# Patient Record
Sex: Male | Born: 1955 | Race: White | Hispanic: No | Marital: Married | Smoking: Former smoker
Health system: Southern US, Community
[De-identification: ages and names within clinical notes are randomized; demographics above are authoritative.]

## PROBLEM LIST (undated history)

## (undated) HISTORY — PX: HERNIA REPAIR: SHX51

---

## 2000-10-15 ENCOUNTER — Ambulatory Visit (HOSPITAL_COMMUNITY): Admission: RE | Admit: 2000-10-15 | Discharge: 2000-10-15 | Payer: Self-pay | Admitting: Orthopaedic Surgery

## 2006-01-02 ENCOUNTER — Ambulatory Visit: Payer: Self-pay | Admitting: Internal Medicine

## 2006-01-28 ENCOUNTER — Ambulatory Visit: Payer: Self-pay | Admitting: Internal Medicine

## 2012-02-10 ENCOUNTER — Ambulatory Visit (INDEPENDENT_AMBULATORY_CARE_PROVIDER_SITE_OTHER): Payer: 59 | Admitting: Emergency Medicine

## 2012-02-10 ENCOUNTER — Encounter: Payer: Self-pay | Admitting: Emergency Medicine

## 2012-02-10 DIAGNOSIS — R739 Hyperglycemia, unspecified: Secondary | ICD-10-CM

## 2012-02-10 DIAGNOSIS — R7309 Other abnormal glucose: Secondary | ICD-10-CM

## 2012-02-10 DIAGNOSIS — Z0000 Encounter for general adult medical examination without abnormal findings: Secondary | ICD-10-CM

## 2012-02-10 DIAGNOSIS — N529 Male erectile dysfunction, unspecified: Secondary | ICD-10-CM | POA: Insufficient documentation

## 2012-02-10 DIAGNOSIS — E785 Hyperlipidemia, unspecified: Secondary | ICD-10-CM | POA: Insufficient documentation

## 2012-02-10 LAB — COMPREHENSIVE METABOLIC PANEL
ALT: 12 U/L (ref 0–53)
AST: 18 U/L (ref 0–37)
Albumin: 4.7 g/dL (ref 3.5–5.2)
Alkaline Phosphatase: 55 U/L (ref 39–117)
BUN: 15 mg/dL (ref 6–23)
CO2: 24 mEq/L (ref 19–32)
Calcium: 9.5 mg/dL (ref 8.4–10.5)
Chloride: 102 mEq/L (ref 96–112)
Creat: 0.75 mg/dL (ref 0.50–1.35)
Glucose, Bld: 93 mg/dL (ref 70–99)
Potassium: 4 mEq/L (ref 3.5–5.3)
Sodium: 139 mEq/L (ref 135–145)
Total Bilirubin: 0.7 mg/dL (ref 0.3–1.2)
Total Protein: 7.2 g/dL (ref 6.0–8.3)

## 2012-02-10 LAB — CBC WITH DIFFERENTIAL/PLATELET
Basophils Absolute: 0 K/uL (ref 0.0–0.1)
Basophils Relative: 0 % (ref 0–1)
Eosinophils Absolute: 0 K/uL (ref 0.0–0.7)
Eosinophils Relative: 1 % (ref 0–5)
HCT: 44.6 % (ref 39.0–52.0)
Hemoglobin: 16 g/dL (ref 13.0–17.0)
Lymphocytes Relative: 24 % (ref 12–46)
Lymphs Abs: 1.1 K/uL (ref 0.7–4.0)
MCH: 31.1 pg (ref 26.0–34.0)
MCHC: 35.9 g/dL (ref 30.0–36.0)
MCV: 86.8 fL (ref 78.0–100.0)
Monocytes Absolute: 0.4 K/uL (ref 0.1–1.0)
Monocytes Relative: 10 % (ref 3–12)
Neutro Abs: 3 K/uL (ref 1.7–7.7)
Neutrophils Relative %: 66 % (ref 43–77)
Platelets: 219 K/uL (ref 150–400)
RBC: 5.14 MIL/uL (ref 4.22–5.81)
RDW: 13.6 % (ref 11.5–15.5)
WBC: 4.5 K/uL (ref 4.0–10.5)

## 2012-02-10 LAB — LIPID PANEL
Cholesterol: 182 mg/dL (ref 0–200)
HDL: 52 mg/dL (ref >39)
LDL Cholesterol: 106 mg/dL — ABNORMAL HIGH (ref 0–99)
Total CHOL/HDL Ratio: 3.5 Ratio
Triglycerides: 118 mg/dL (ref <150)
VLDL: 24 mg/dL (ref 0–40)

## 2012-02-10 LAB — POCT URINALYSIS DIPSTICK
Bilirubin, UA: NEGATIVE
Blood, UA: NEGATIVE
Glucose, UA: NEGATIVE
Ketones, UA: 15
Leukocytes, UA: NEGATIVE
Nitrite, UA: NEGATIVE
Protein, UA: NEGATIVE
Spec Grav, UA: 1.02
Urobilinogen, UA: 0.2
pH, UA: 6.5

## 2012-02-10 LAB — POCT UA - MICROSCOPIC ONLY
Crystals, Ur, HPF, POC: NEGATIVE
RBC, urine, microscopic: 0.2
WBC, Ur, HPF, POC: NEGATIVE
Yeast, UA: NEGATIVE

## 2012-02-10 LAB — IFOBT (OCCULT BLOOD): IFOBT: NEGATIVE

## 2012-02-10 LAB — POCT GLYCOSYLATED HEMOGLOBIN (HGB A1C): Hemoglobin A1C: 5.4

## 2012-02-10 LAB — GLUCOSE, POCT (MANUAL RESULT ENTRY): POC Glucose: 88

## 2012-02-10 LAB — TSH: TSH: 2.905 u[IU]/mL (ref 0.350–4.500)

## 2012-02-10 NOTE — Progress Notes (Signed)
1-Near w/o: OD:20/50  2-OS:20/40  3-OU20/40   Near with: OD:20/25  OD:20/25  OU:20/25       2-Far w/o:  OD:20/30   OS:20/30   OU:20/30    3-Far with: OD:20/20  OS:20/20   OU:20/20   Intermediate:w/out  OD:20/20   OS:20/20   OD:20/20    With: OD:20/20   OS:20/20   OU:20/20     ISHIHARA: 14 of 14 plates   Peripheral:OD: 85 degrees  OS: 85 degrees

## 2012-02-10 NOTE — Progress Notes (Signed)
  Subjective:    Patient ID: Damon Pope, male    DOB: 09-16-1956, 56 y.o.   MRN: [id number]  HPI patient is here for his yearly physical and FAA exam. He does not like to take medication and prefers to treat his high cholesterol with diet. He was on exercise program recently he was able to lose weight however since Christmas he has not done well with this and started to put on weight again.    Review of Systems  Constitutional: Negative.   HENT: Negative.   Eyes: Negative.   Respiratory: Negative.   Cardiovascular: Negative.   Gastrointestinal: Negative.   Genitourinary: Negative.   Musculoskeletal: Negative.   Skin: Negative.   Neurological: Negative.   Hematological: Negative.   Psychiatric/Behavioral: Negative.        Objective:   Physical Exam  Constitutional: He appears well-developed and well-nourished.  HENT:  Head: Normocephalic.  Right Ear: External ear normal.  Left Ear: External ear normal.  Nose: Nose normal.  Mouth/Throat: No oropharyngeal exudate.  Eyes: EOM are normal. Pupils are equal, round, and reactive to light.  Neck: No JVD present. No tracheal deviation present. No thyromegaly present.  Cardiovascular: Normal rate, normal heart sounds and intact distal pulses.        He has occasional skipped beats on auscultatory exam  Pulmonary/Chest: Breath sounds normal. No respiratory distress. He has no wheezes. He has no rales. He exhibits no tenderness.  Abdominal: He exhibits no distension and no mass. There is no tenderness. There is no rebound and no guarding.  Genitourinary: Rectum normal and penis normal.  Musculoskeletal: He exhibits no edema and no tenderness.  Lymphadenopathy:    He has no cervical adenopathy.  Neurological: He is alert. No cranial nerve deficit. Coordination normal.  Skin: Skin is warm and dry.  Psychiatric: He has a normal mood and affect. His behavior is normal.          Assessment & Plan:  Patient is overall normal  physical exam. 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 Check baseline cholesterol today and that this has been elevated in the past. He knows he needs to start back on his exercise program and dropped his weight

## 2012-02-12 ENCOUNTER — Encounter: Payer: Self-pay | Admitting: Radiology

## 2012-02-18 ENCOUNTER — Telehealth: Payer: Self-pay

## 2012-02-18 DIAGNOSIS — N529 Male erectile dysfunction, unspecified: Secondary | ICD-10-CM

## 2012-02-18 NOTE — Telephone Encounter (Signed)
Seen recently for a CPE with Dr. Cleta Alberts, would like an rx for Levitra called into [facility] on [address]

## 2012-02-18 NOTE — Telephone Encounter (Signed)
Dr. Cleta Alberts there is no documentation about the need for Levitra so I will let you decide if this is appropriate.

## 2012-02-19 MED ORDER — VARDENAFIL HCL 20 MG PO TABS: 20.0000 mg | ORAL_TABLET | Freq: Every day | ORAL | Status: AC | PRN

## 2012-02-19 NOTE — Telephone Encounter (Signed)
Please let patient know I sent his medications to the pharmacy to

## 2012-02-20 NOTE — Telephone Encounter (Signed)
PT Notified

## 2012-03-02 ENCOUNTER — Ambulatory Visit: Payer: Self-pay | Admitting: Emergency Medicine

## 2013-04-19 ENCOUNTER — Encounter: Payer: 59 | Admitting: Emergency Medicine

## 2013-05-06 ENCOUNTER — Encounter: Payer: 59 | Admitting: Family Medicine

## 2013-05-10 ENCOUNTER — Encounter: Payer: Self-pay | Admitting: Family Medicine

## 2013-05-10 ENCOUNTER — Ambulatory Visit (INDEPENDENT_AMBULATORY_CARE_PROVIDER_SITE_OTHER): Payer: 59 | Admitting: Family Medicine

## 2013-05-10 VITALS — BP 129/92 | HR 74 | Temp 97.5°F | Resp 18 | Ht 73.0 in | Wt 227.0 lb

## 2013-05-10 DIAGNOSIS — Z0000 Encounter for general adult medical examination without abnormal findings: Secondary | ICD-10-CM

## 2013-05-10 DIAGNOSIS — E663 Overweight: Secondary | ICD-10-CM | POA: Insufficient documentation

## 2013-05-10 LAB — POCT GLYCOSYLATED HEMOGLOBIN (HGB A1C): Hemoglobin A1C: 5.5

## 2013-05-10 LAB — POCT URINALYSIS DIPSTICK
Bilirubin, UA: NEGATIVE
Blood, UA: NEGATIVE
Ketones, UA: NEGATIVE
Protein, UA: NEGATIVE
pH, UA: 6.5

## 2013-05-10 NOTE — Patient Instructions (Signed)

## 2013-05-10 NOTE — Progress Notes (Signed)
Subjective:    Patient ID: Damon Pope, male    DOB: 02/22/1956, 57 y.o.   MRN: [id number]  HPI  This 57 y.o. Cauc male is here for CPE and labs for employee-sponsored wellness program. He has FAA pilot physical performed every 2 years by Dr. Cleta Alberts. He has no chronic health issues  and is exercising for weight reduction. Review of labs from March 2013 revealed no significant  abnormalities (LDL=106) and T Chol/HDL ratio= 3.5. HCM screenings are current.  Patient Active Problem List   Diagnosis Date Noted  . ED (erectile dysfunction) 02/10/2012  . Hyperglycemia 02/10/2012  . Hyperlipidemia 02/10/2012    PMHx, Soc Hx and Fam Hx reviewed.   Review of Systems  Constitutional: Negative for fever, appetite change, fatigue and unexpected weight change.  HENT: Negative.   Eyes:       Some vision changes; pt uses "readers".  Respiratory: Negative for apnea, cough, chest tightness and shortness of breath.   Cardiovascular: Negative.   Gastrointestinal: Negative.   Endocrine: Negative.   Genitourinary: Negative.   Musculoskeletal: Negative.   Skin: Negative.   Allergic/Immunologic: Negative.   Neurological: Negative.   Hematological: Negative.   Psychiatric/Behavioral: Negative.        Objective:   Physical Exam  Nursing note and vitals reviewed. Constitutional: He is oriented to person, place, and time. Vital signs are normal. He appears well-developed and well-nourished. No distress.  HENT:  Head: Normocephalic and atraumatic.  Right Ear: Hearing, tympanic membrane, external ear and ear canal normal.  Left Ear: Hearing, tympanic membrane, external ear and ear canal normal.  Nose: Nose normal. No mucosal edema, rhinorrhea, nasal deformity or septal deviation.  Mouth/Throat: Uvula is midline, oropharynx is clear and moist and mucous membranes are normal. No oral lesions. Normal dentition. No dental caries.  Eyes: Conjunctivae, EOM and lids are normal. No scleral icterus.   Fundoscopic exam:      The right eye shows no arteriolar narrowing and no papilledema. The right eye shows red reflex.       The left eye shows no arteriolar narrowing and no papilledema. The left eye shows red reflex.  Fundi difficult to assess.  Neck: Normal range of motion. Neck supple. No JVD present. No thyromegaly present.  Cardiovascular: Normal rate, regular rhythm, normal heart sounds and intact distal pulses.  Exam reveals no gallop and no friction rub.   No murmur heard. Pulmonary/Chest: Effort normal and breath sounds normal. No respiratory distress. He has no wheezes. He exhibits no tenderness.  Abdominal: Soft. Normal appearance and normal aorta. He exhibits no distension, no pulsatile midline mass and no mass. There is no hepatosplenomegaly. There is no tenderness. There is no guarding and no CVA tenderness. No hernia.  Genitourinary:  GU exam deferred; pt has no symptoms of any GI, GU or prostate problems.  Musculoskeletal: Normal range of motion. He exhibits no edema and no tenderness.  Lymphadenopathy:    He has no cervical adenopathy.  Neurological: He is alert and oriented to person, place, and time. He has normal reflexes. No cranial nerve deficit. He exhibits normal muscle tone. Coordination normal.  Skin: Skin is warm and dry. No rash noted. No erythema. No pallor.  Psychiatric: He has a normal mood and affect. His behavior is normal. Judgment and thought content normal.    Results for orders placed in visit on 05/10/13  POCT URINALYSIS DIPSTICK      Result Value Range   Color, UA yellow  Clarity, UA clear     Glucose, UA neg     Bilirubin, UA neg     Ketones, UA neg     Spec Grav, UA 1.010     Blood, UA neg     pH, UA 6.5     Protein, UA neg     Urobilinogen, UA 0.2     Nitrite, UA neg     Leukocytes, UA Negative    POCT GLYCOSYLATED HEMOGLOBIN (HGB A1C)      Result Value Range   Hemoglobin A1C 5.5    IFOBT (OCCULT BLOOD)      Result Value Range    IFOBT Negative          Assessment & Plan:  Routine general medical examination at a health care facility - Plan: POCT urinalysis dipstick, Comprehensive metabolic panel, Lipid panel, PSA, POCT glycosylated hemoglobin (Hb A1C), IFOBT POC (occult bld, rslt in office)  Overweight (BMI 25.0-29.9)  RTC in 2014 for FAA physical with Dr. Cleta Alberts.

## 2013-05-11 LAB — LIPID PANEL
Cholesterol: 184 mg/dL (ref 0–200)
HDL: 53 mg/dL (ref >39)
Total CHOL/HDL Ratio: 3.5 Ratio
Triglycerides: 135 mg/dL (ref <150)

## 2013-05-11 LAB — COMPREHENSIVE METABOLIC PANEL
BUN: 12 mg/dL (ref 6–23)
CO2: 25 mEq/L (ref 19–32)
Calcium: 9.3 mg/dL (ref 8.4–10.5)
Chloride: 103 mEq/L (ref 96–112)
Creat: 0.86 mg/dL (ref 0.50–1.35)

## 2013-05-11 NOTE — Progress Notes (Signed)
Quick Note:  Please notify pt that results are normal.   Provide pt with copy of labs. ______ 

## 2014-01-10 ENCOUNTER — Telehealth: Payer: Self-pay

## 2014-01-10 NOTE — Telephone Encounter (Signed)
He called regarding mother, Damon Pope 09/23/28, she needs to come in and get PT/INR rechecked. Per Dr. Dareen PianoAnderson place standing order for PT/INR. He will bring her in today for lab only visit

## 2014-01-10 NOTE — Telephone Encounter (Signed)
PT WOULD LIKE A CALL BACK FROM DR Dareen PianoANDERSON OR HIS ASSISTANCE, DIDN'T WANT TO GO INTO DETAIL WITH ME, STATES IT IS ABOUT HIS VISIT PLEASE CALL [phone]

## 2014-02-07 ENCOUNTER — Other Ambulatory Visit (INDEPENDENT_AMBULATORY_CARE_PROVIDER_SITE_OTHER): Payer: 59 | Admitting: Emergency Medicine

## 2014-02-07 ENCOUNTER — Ambulatory Visit: Payer: 59 | Admitting: Emergency Medicine

## 2014-02-07 VITALS — BP 128/92 | HR 78 | Temp 98.7°F | Resp 16 | Ht 72.5 in | Wt 241.0 lb

## 2014-02-07 DIAGNOSIS — R635 Abnormal weight gain: Secondary | ICD-10-CM

## 2014-02-07 DIAGNOSIS — I10 Essential (primary) hypertension: Secondary | ICD-10-CM

## 2014-02-07 LAB — CBC WITH DIFFERENTIAL/PLATELET
Basophils Absolute: 0 10*3/uL (ref 0.0–0.1)
Basophils Relative: 0 % (ref 0–1)
EOS PCT: 2 % (ref 0–5)
Eosinophils Absolute: 0.1 10*3/uL (ref 0.0–0.7)
HEMATOCRIT: 46.2 % (ref 39.0–52.0)
HEMOGLOBIN: 16.3 g/dL (ref 13.0–17.0)
Lymphocytes Relative: 23 % (ref 12–46)
Lymphs Abs: 1.1 10*3/uL (ref 0.7–4.0)
MCH: 31.2 pg (ref 26.0–34.0)
MCHC: 35.3 g/dL (ref 30.0–36.0)
MCV: 88.5 fL (ref 78.0–100.0)
MONO ABS: 0.6 10*3/uL (ref 0.1–1.0)
MONOS PCT: 12 % (ref 3–12)
Neutro Abs: 3 10*3/uL (ref 1.7–7.7)
Neutrophils Relative %: 63 % (ref 43–77)
Platelets: 256 10*3/uL (ref 150–400)
RBC: 5.22 MIL/uL (ref 4.22–5.81)
RDW: 14.8 % (ref 11.5–15.5)
WBC: 4.8 10*3/uL (ref 4.0–10.5)

## 2014-02-07 LAB — POCT URINALYSIS DIPSTICK
Bilirubin, UA: NEGATIVE
Blood, UA: NEGATIVE
Glucose, UA: NEGATIVE
KETONES UA: NEGATIVE
Leukocytes, UA: NEGATIVE
Nitrite, UA: NEGATIVE
PROTEIN UA: NEGATIVE
SPEC GRAV UA: 1.015
Urobilinogen, UA: 0.2
pH, UA: 7

## 2014-02-07 LAB — IFOBT (OCCULT BLOOD): IFOBT: NEGATIVE

## 2014-02-07 LAB — COMPLETE METABOLIC PANEL WITH GFR
ALBUMIN: 4.3 g/dL (ref 3.5–5.2)
ALT: 12 U/L (ref 0–53)
AST: 19 U/L (ref 0–37)
Alkaline Phosphatase: 58 U/L (ref 39–117)
BUN: 13 mg/dL (ref 6–23)
CO2: 27 meq/L (ref 19–32)
CREATININE: 0.93 mg/dL (ref 0.50–1.35)
Calcium: 9.2 mg/dL (ref 8.4–10.5)
Chloride: 101 mEq/L (ref 96–112)
GFR, Est Non African American: >89 mL/min
GLUCOSE: 126 mg/dL — AB (ref 70–99)
Potassium: 4.7 mEq/L (ref 3.5–5.3)
Sodium: 138 mEq/L (ref 135–145)
Total Bilirubin: 0.6 mg/dL (ref 0.2–1.2)
Total Protein: 7.2 g/dL (ref 6.0–8.3)

## 2014-02-07 LAB — LIPID PANEL
CHOLESTEROL: 205 mg/dL — AB (ref 0–200)
HDL: 55 mg/dL (ref >39)
LDL Cholesterol: 125 mg/dL — ABNORMAL HIGH (ref 0–99)
Total CHOL/HDL Ratio: 3.7 Ratio
Triglycerides: 127 mg/dL (ref <150)
VLDL: 25 mg/dL (ref 0–40)

## 2014-02-07 LAB — TSH: TSH: 3.385 u[IU]/mL (ref 0.350–4.500)

## 2014-02-07 NOTE — Progress Notes (Signed)
   Subjective:    Patient ID: Damon ChamberMichael W Spade, male    DOB: 06/23/1956, 58 y.o.   MRN: [id number]  HPI patient here for lab work only. 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 but was fasting and so labs were done today prior to his physical to    Review of Systems     Objective:   Physical Exam  Results for orders placed in visit on 02/07/14  IFOBT (OCCULT BLOOD)      Result Value Ref Range   IFOBT Negative    POCT URINALYSIS DIPSTICK      Result Value Ref Range   Color, UA yellow     Clarity, UA clear     Glucose, UA neg     Bilirubin, UA neg     Ketones, UA neg     Spec Grav, UA 1.015     Blood, UA neg     pH, UA 7.0     Protein, UA neg     Urobilinogen, UA 0.2     Nitrite, UA neg     Leukocytes, UA Negative          Assessment & Plan:  Labs drawn he matured in return to clinic for his physical and evaluation

## 2014-02-08 LAB — PSA: PSA: 1.02 ng/mL (ref <=4.00)

## 2014-02-08 NOTE — Progress Notes (Signed)
   Subjective:    Patient ID: Damon Pope, male    DOB: 01/31/1956, 58 y.o.   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 Labs were drawn because of his history of hyperglycemia hyperlipidemia and obesity. He also has ED. He plans to return to clinic with an office visit to discuss these results    Review of Systems     Objective:   Physical Exam        Assessment & Plan:  Labs were drawn. Patient to return to clinic for office visit discuss these results

## 2014-04-25 ENCOUNTER — Ambulatory Visit: Payer: 59 | Admitting: Emergency Medicine

## 2014-08-01 ENCOUNTER — Encounter: Payer: Self-pay | Admitting: Emergency Medicine

## 2014-08-01 ENCOUNTER — Encounter: Payer: 59 | Admitting: Emergency Medicine

## 2014-08-01 ENCOUNTER — Ambulatory Visit (INDEPENDENT_AMBULATORY_CARE_PROVIDER_SITE_OTHER): Payer: 59 | Admitting: Emergency Medicine

## 2014-08-01 ENCOUNTER — Ambulatory Visit (INDEPENDENT_AMBULATORY_CARE_PROVIDER_SITE_OTHER): Payer: 59

## 2014-08-01 VITALS — BP 130/66 | HR 82 | Temp 98.7°F | Resp 16 | Ht 72.75 in | Wt 244.2 lb

## 2014-08-01 DIAGNOSIS — M25569 Pain in unspecified knee: Secondary | ICD-10-CM

## 2014-08-01 DIAGNOSIS — M25561 Pain in right knee: Secondary | ICD-10-CM

## 2014-08-01 NOTE — Progress Notes (Signed)
   Subjective:    Patient ID: Damon Pope, male    DOB: 1956-05-10, 58 y.o.   MRN: [id number]  This chart was scribed for Collene Gobble, MD by Milly Jakob, ED Scribe. The patient was seen in room 25. Patient's care was started at 11:40 AM.  HPI HPI Comments: Damon Pope is a 58 y.o. male who presents to the [facility] complaining of swelling and discomfort in his right knee. He denies injury to this knee. He reports that he works out regularly in a functional fitness class, and recently when he was on a long walk up hill his knee began to bother him. He states that he is unable to bend it fully backwards, and that the discomfort makes it difficult to walk after sitting for long periods. He reports rubbing his knee down at night with moderate relief. He reports taking  of aspirin at night, but denies taking other medication for his knee. He reports switching to a stretch class from his functional fitness class. He denies trouble with this knee in the past.   Review of Systems  Constitutional: Negative for fatigue and unexpected weight change.  Eyes: Negative for visual disturbance.  Respiratory: Negative for cough, chest tightness and shortness of breath.   Cardiovascular: Negative for chest pain, palpitations and leg swelling.  Gastrointestinal: Negative for abdominal pain and blood in stool.  Musculoskeletal: Positive for arthralgias (right knee) and joint swelling.  Neurological: Negative for dizziness, light-headedness and headaches.    Objective:  Physical Exam  CONSTITUTIONAL: Well developed/well nourished HEAD: Normocephalic/atraumatic EYES: EOMI/PERRL ENMT: Mucous membranes moist NECK: supple no meningeal signs SPINE:entire spine nontender CV: S1/S2 noted, no murmurs/rubs/gallops noted LUNGS: Lungs are clear to auscultation bilaterally, no apparent distress ABDOMEN: soft, nontender, no rebound or guarding GU:no cva tenderness NEURO: Pt is  awake/alert, moves all extremitiesx4 EXTREMITIES: pulses normal, full ROM. Popping of the right knee with flexion and extension. There appears to be degenerative changes of the knee. There is a small joint effusion. Anterior drawer sign is negative. McMurray testing is negative. SKIN: warm, color normal PSYCH: no abnormalities of mood noted [facility] reading (PRIMARY) by  Dr. Cleta Alberts there are degenerative changes of the knee no other abnormalities to  Assessment & Plan:   Patient has an overuse problem with his knee. He will take high-dose ibuprofen for the next 7-10 days and let me know if this does not resolve his symptoms. He will cut back on his knee exercises.

## 2015-01-30 ENCOUNTER — Encounter: Payer: Self-pay | Admitting: Emergency Medicine

## 2015-01-30 ENCOUNTER — Other Ambulatory Visit: Payer: Self-pay | Admitting: Emergency Medicine

## 2015-01-30 ENCOUNTER — Ambulatory Visit (INDEPENDENT_AMBULATORY_CARE_PROVIDER_SITE_OTHER): Payer: 59 | Admitting: Emergency Medicine

## 2015-01-30 VITALS — BP 139/84 | HR 73 | Temp 98.5°F | Resp 16 | Ht 73.0 in | Wt 240.0 lb

## 2015-01-30 DIAGNOSIS — Z1211 Encounter for screening for malignant neoplasm of colon: Secondary | ICD-10-CM

## 2015-01-30 DIAGNOSIS — R739 Hyperglycemia, unspecified: Secondary | ICD-10-CM | POA: Diagnosis not present

## 2015-01-30 DIAGNOSIS — E785 Hyperlipidemia, unspecified: Secondary | ICD-10-CM | POA: Diagnosis not present

## 2015-01-30 DIAGNOSIS — Z1329 Encounter for screening for other suspected endocrine disorder: Secondary | ICD-10-CM | POA: Diagnosis not present

## 2015-01-30 DIAGNOSIS — E663 Overweight: Secondary | ICD-10-CM

## 2015-01-30 DIAGNOSIS — R21 Rash and other nonspecific skin eruption: Secondary | ICD-10-CM | POA: Diagnosis not present

## 2015-01-30 DIAGNOSIS — Z0000 Encounter for general adult medical examination without abnormal findings: Secondary | ICD-10-CM

## 2015-01-30 DIAGNOSIS — N529 Male erectile dysfunction, unspecified: Secondary | ICD-10-CM | POA: Diagnosis not present

## 2015-01-30 LAB — POCT URINALYSIS DIPSTICK
BILIRUBIN UA: NEGATIVE
Glucose, UA: NEGATIVE
KETONES UA: NEGATIVE
LEUKOCYTES UA: NEGATIVE
Nitrite, UA: NEGATIVE
PH UA: 5.5
PROTEIN UA: NEGATIVE
RBC UA: NEGATIVE
SPEC GRAV UA: 1.015
Urobilinogen, UA: 0.2

## 2015-01-30 LAB — CBC WITH DIFFERENTIAL/PLATELET
BASOS PCT: 1 % (ref 0–1)
Basophils Absolute: 0 10*3/uL (ref 0.0–0.1)
EOS ABS: 0.1 10*3/uL (ref 0.0–0.7)
Eosinophils Relative: 3 % (ref 0–5)
HEMATOCRIT: 44.2 % (ref 39.0–52.0)
HEMOGLOBIN: 15.4 g/dL (ref 13.0–17.0)
Lymphocytes Relative: 24 % (ref 12–46)
Lymphs Abs: 1.1 10*3/uL (ref 0.7–4.0)
MCH: 31.2 pg (ref 26.0–34.0)
MCHC: 34.8 g/dL (ref 30.0–36.0)
MCV: 89.7 fL (ref 78.0–100.0)
MONOS PCT: 11 % (ref 3–12)
MPV: 9.9 fL (ref 8.6–12.4)
Monocytes Absolute: 0.5 10*3/uL (ref 0.1–1.0)
NEUTROS ABS: 2.7 10*3/uL (ref 1.7–7.7)
NEUTROS PCT: 61 % (ref 43–77)
Platelets: 231 10*3/uL (ref 150–400)
RBC: 4.93 MIL/uL (ref 4.22–5.81)
RDW: 14.7 % (ref 11.5–15.5)
WBC: 4.5 10*3/uL (ref 4.0–10.5)

## 2015-01-30 LAB — GLUCOSE, POCT (MANUAL RESULT ENTRY): POC GLUCOSE: 120 mg/dL — AB (ref 70–99)

## 2015-01-30 LAB — COMPLETE METABOLIC PANEL WITH GFR
ALBUMIN: 4.5 g/dL (ref 3.5–5.2)
ALT: 14 U/L (ref 0–53)
AST: 16 U/L (ref 0–37)
Alkaline Phosphatase: 58 U/L (ref 39–117)
BUN: 18 mg/dL (ref 6–23)
CO2: 25 mEq/L (ref 19–32)
Calcium: 9 mg/dL (ref 8.4–10.5)
Chloride: 103 mEq/L (ref 96–112)
Creat: 0.79 mg/dL (ref 0.50–1.35)
GFR, Est African American: >89 mL/min
GFR, Est Non African American: >89 mL/min
Glucose, Bld: 115 mg/dL — ABNORMAL HIGH (ref 70–99)
POTASSIUM: 4.7 meq/L (ref 3.5–5.3)
Sodium: 140 mEq/L (ref 135–145)
Total Bilirubin: 0.4 mg/dL (ref 0.2–1.2)
Total Protein: 6.8 g/dL (ref 6.0–8.3)

## 2015-01-30 LAB — IFOBT (OCCULT BLOOD): IFOBT: NEGATIVE

## 2015-01-30 LAB — LIPID PANEL
CHOL/HDL RATIO: 3.3 ratio
CHOLESTEROL: 186 mg/dL (ref 0–200)
HDL: 56 mg/dL (ref >=40)
LDL Cholesterol: 111 mg/dL — ABNORMAL HIGH (ref 0–99)
Triglycerides: 95 mg/dL (ref <150)
VLDL: 19 mg/dL (ref 0–40)

## 2015-01-30 LAB — POCT GLYCOSYLATED HEMOGLOBIN (HGB A1C): Hemoglobin A1C: 6.1

## 2015-01-30 LAB — TSH: TSH: 3.234 u[IU]/mL (ref 0.350–4.500)

## 2015-01-30 MED ORDER — CLOTRIMAZOLE-BETAMETHASONE 1-0.05 % EX CREA: 1.0000 "application " | TOPICAL_CREAM | Freq: Two times a day (BID) | CUTANEOUS | Status: AC

## 2015-01-30 MED ORDER — TADALAFIL 20 MG PO TABS: 10.0000 mg | ORAL_TABLET | ORAL | Status: AC | PRN

## 2015-01-30 NOTE — Progress Notes (Signed)
   Subjective:    Patient ID: Damon Pope [facility], male    DOB: 07/15/1956, 59 y.o.   MRN: [id number] This chart was scribed for Lesle ChrisSteven Jaclyne Haverstick, MD by Littie Deedsichard Sun, Medical Scribe. This patient was seen in room 22 and the patient's care was started at 8:23 AM.   HPI HPI Comments: Damon Pope Wyne is a 59 y.o. male with a hx of HLD and hyperglycemia who presents to the [facility] for a complete physical exam. Patient states he has been doing well overall. He states he did smoke when he was in high school. He reports having a little more than 2 drinks per day, and typically drinks red wine. Patient does visit the eye doctor yearly. His [facility] specialist is at [facility]. Patient goes to an exercise class twice a week. He does not want the shingles vaccine and does not usually get the flu vaccine. He states his umbilical hernia does not bother him.  Patient recently returned from [place] skiing in the mountains. [place] in the [place]. He plans to retire when he is [id number]. He is a Control and instrumentation engineercommercial pilot.  Review of Systems     Objective:   Physical Exam CONSTITUTIONAL: Well developed/well nourished HEAD: Normocephalic/atraumatic EYES: EOM/PERRL ENMT: Mucous membranes moist NECK: supple no meningeal signs SPINE: entire spine nontender CV: S1/S2 noted, no murmurs/rubs/gallops noted LUNGS: Lungs are clear to auscultation bilaterally, no apparent distress ABDOMEN: soft, nontender, no rebound or guarding GU: no cva tenderness NEURO: Pt is awake/alert, moves all extremitiesx4 EXTREMITIES: pulses normal, full ROM SKIN: warm, color normal there is a red crusty rash between both buttocks. PSYCH: no abnormalities of mood noted Results for orders placed or performed in visit on 01/30/15  POCT glucose (manual entry)  Result Value Ref Range   POC Glucose 120 (A) 70 - 99 mg/dl  POCT glycosylated hemoglobin (Hb A1C)  Result Value Ref Range   Hemoglobin A1C 6.1   POCT urinalysis  dipstick  Result Value Ref Range   Color, UA yellow    Clarity, UA clear    Glucose, UA neg    Bilirubin, UA neg    Ketones, UA neg    Spec Grav, UA 1.015    Blood, UA neg    pH, UA 5.5    Protein, UA neg    Urobilinogen, UA 0.2    Nitrite, UA neg    Leukocytes, UA Negative   IFOBT POC (occult bld, rslt in office)  Result Value Ref Range   IFOBT Negative          Assessment & Plan:  Routine labs were done. Patient advised to cut back on his alcohol intake. Gave him information about diabetes control he is trying to work on his diet. He declined to get a flu shot or shingles vaccine. His hemoglobin A1c is 6.1 recheck in 4 months.I personally performed the services described in this documentation, which was scribed in my presence. The recorded information has been reviewed and is accurate.

## 2015-01-30 NOTE — Patient Instructions (Signed)

## 2015-01-31 LAB — HEMOGLOBIN A1C
HEMOGLOBIN A1C: 6 % — AB (ref <5.7)
Mean Plasma Glucose: 126 mg/dL — ABNORMAL HIGH (ref <117)

## 2015-01-31 LAB — PSA: PSA: 1.16 ng/mL (ref <=4.00)

## 2015-03-08 ENCOUNTER — Other Ambulatory Visit: Payer: Self-pay

## 2015-03-08 ENCOUNTER — Telehealth: Payer: Self-pay | Admitting: Emergency Medicine

## 2015-03-08 NOTE — Telephone Encounter (Signed)
I received a call from the medical examiner that the patient had a cardiac arrest at 3 AM in the morning. The history was the patient had played a round of golf this week and that  After playing  did not feel well. He had significant shoulder discomfort that night he attributed to overuse. The evening prior to his death he went and worked out in [employer] and told his wife he did not feel well and had a very difficult workout. Apparently he got up in the middle of the night at about 3 AM  but when he returned to the bed he collapsed and stopped breathing. She called 911. She started CPR. He was pronounced at the hospital. I discussed the case with the medical examiner who felt this was consistent with an acute coronary syndrome and cardiac death. I agreed to sign the death certificate. I called and spoke to Mrs.  Redner  and told her I would be happy to help in any way I could and  to please let me know if she needed anything.

## 2015-04-01 DEATH — deceased

## 2015-07-03 ENCOUNTER — Ambulatory Visit: Payer: 59 | Admitting: Emergency Medicine

## 2016-04-01 ENCOUNTER — Encounter: Payer: Self-pay | Admitting: Internal Medicine

## 2016-04-20 IMAGING — CR DG KNEE COMPLETE 4+V*R*
4 series · 4 of 4 positions shown · non-contrast
Comparison: None.

CLINICAL DATA: Knee pain on the right.  Swelling.

EXAM:
RIGHT KNEE - COMPLETE 4+ VIEW

[AP]
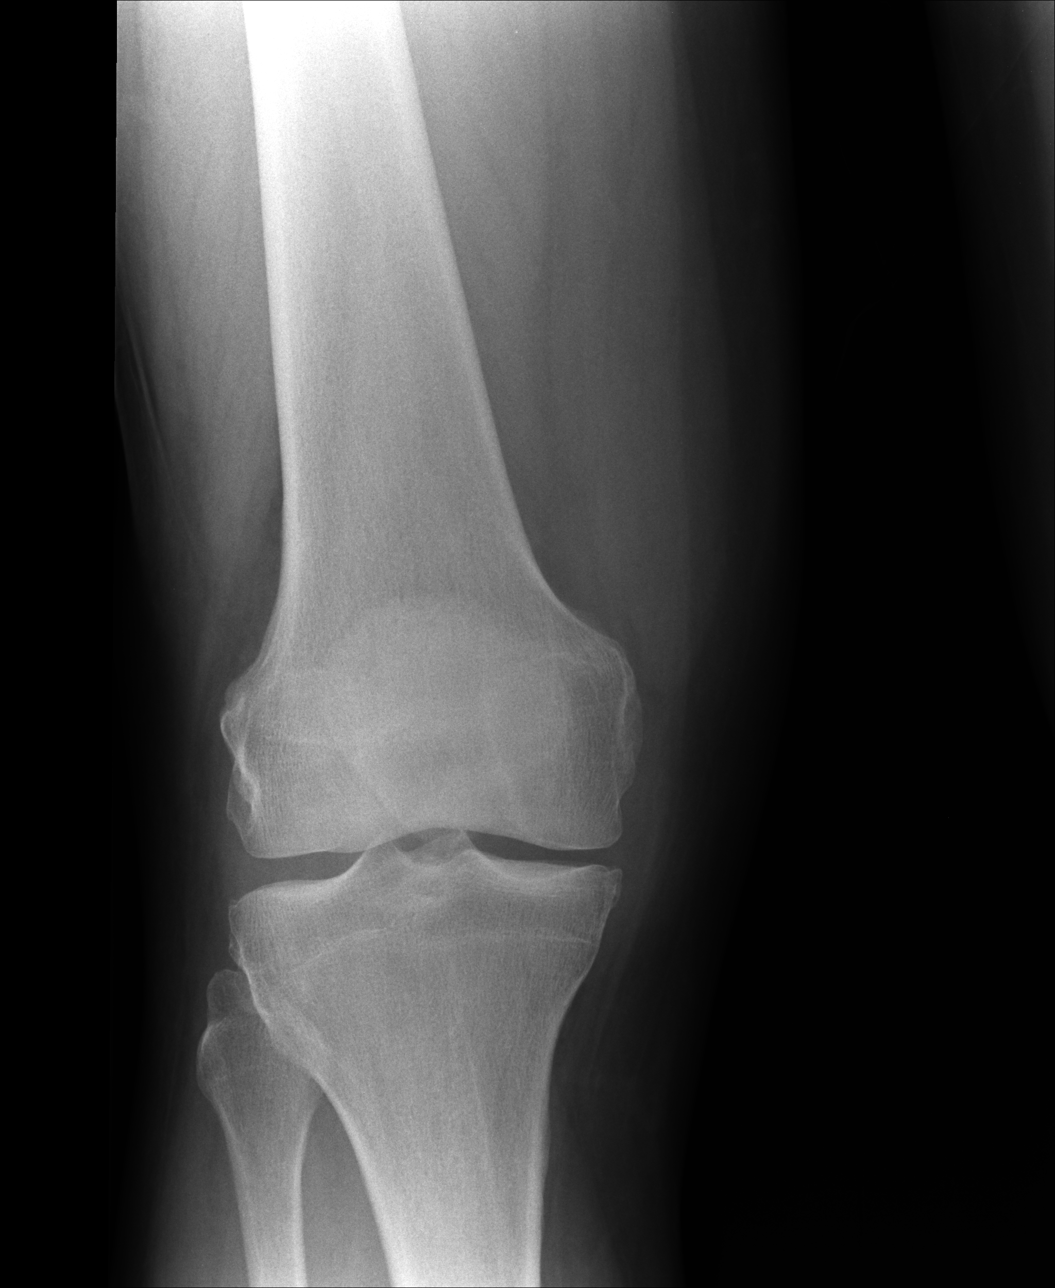

[lateral]
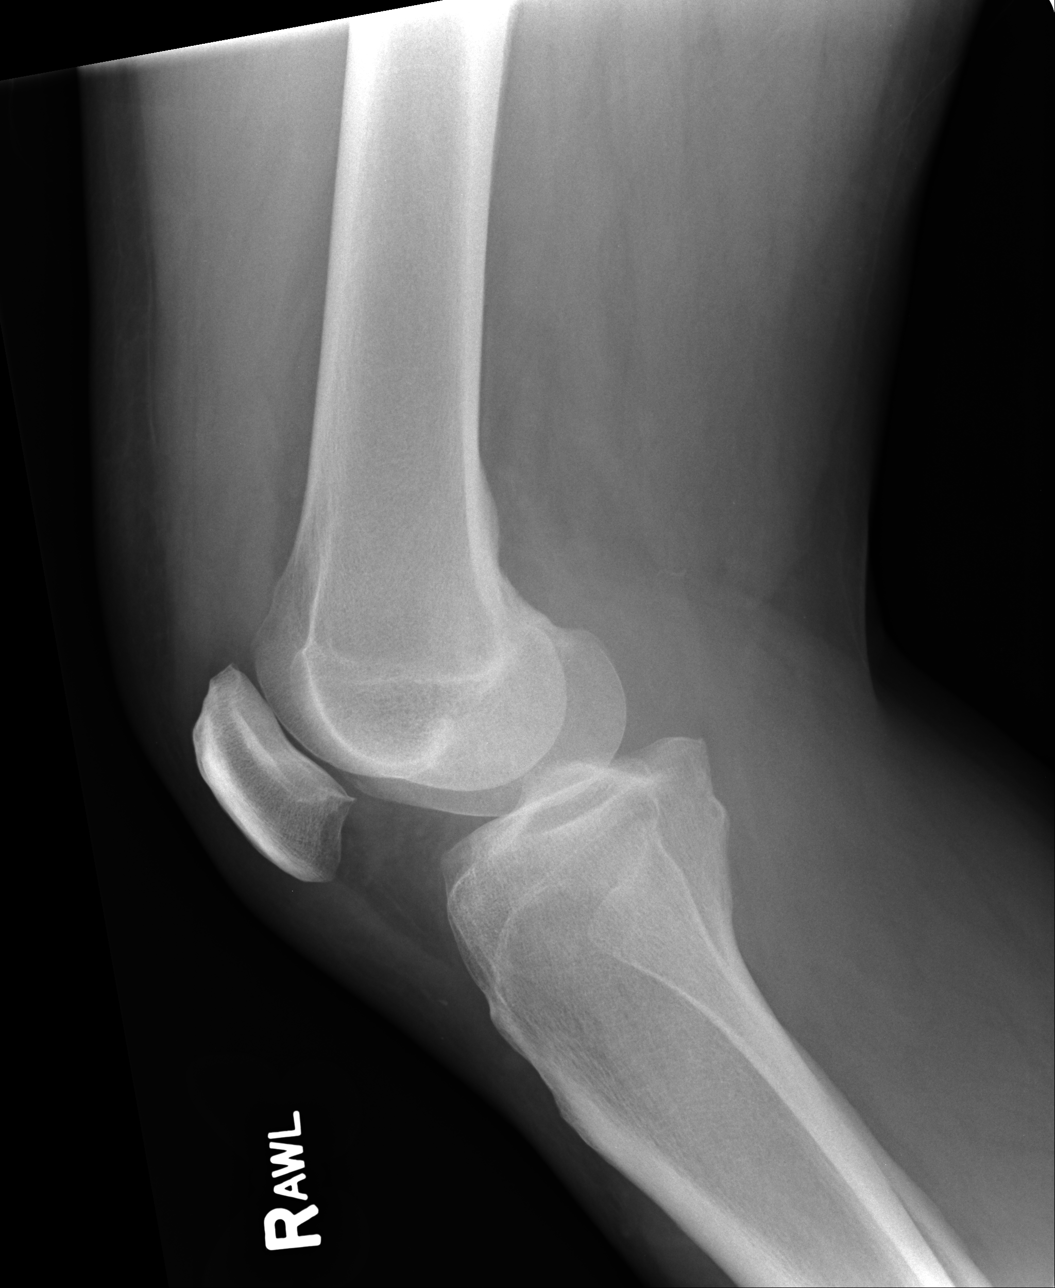

[other (1 of 2)]
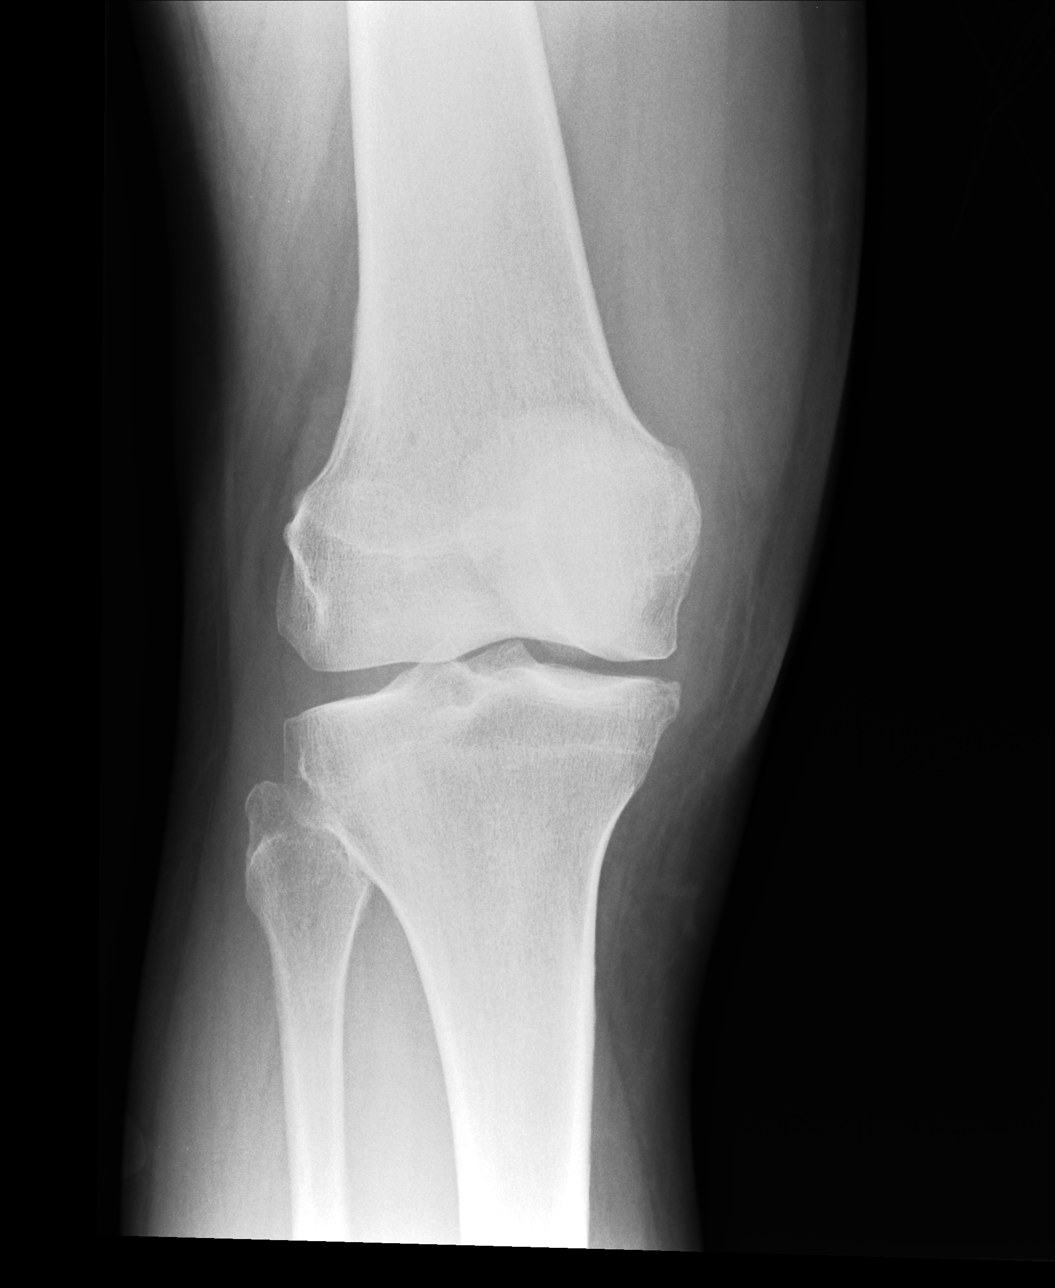

[other (2 of 2)]
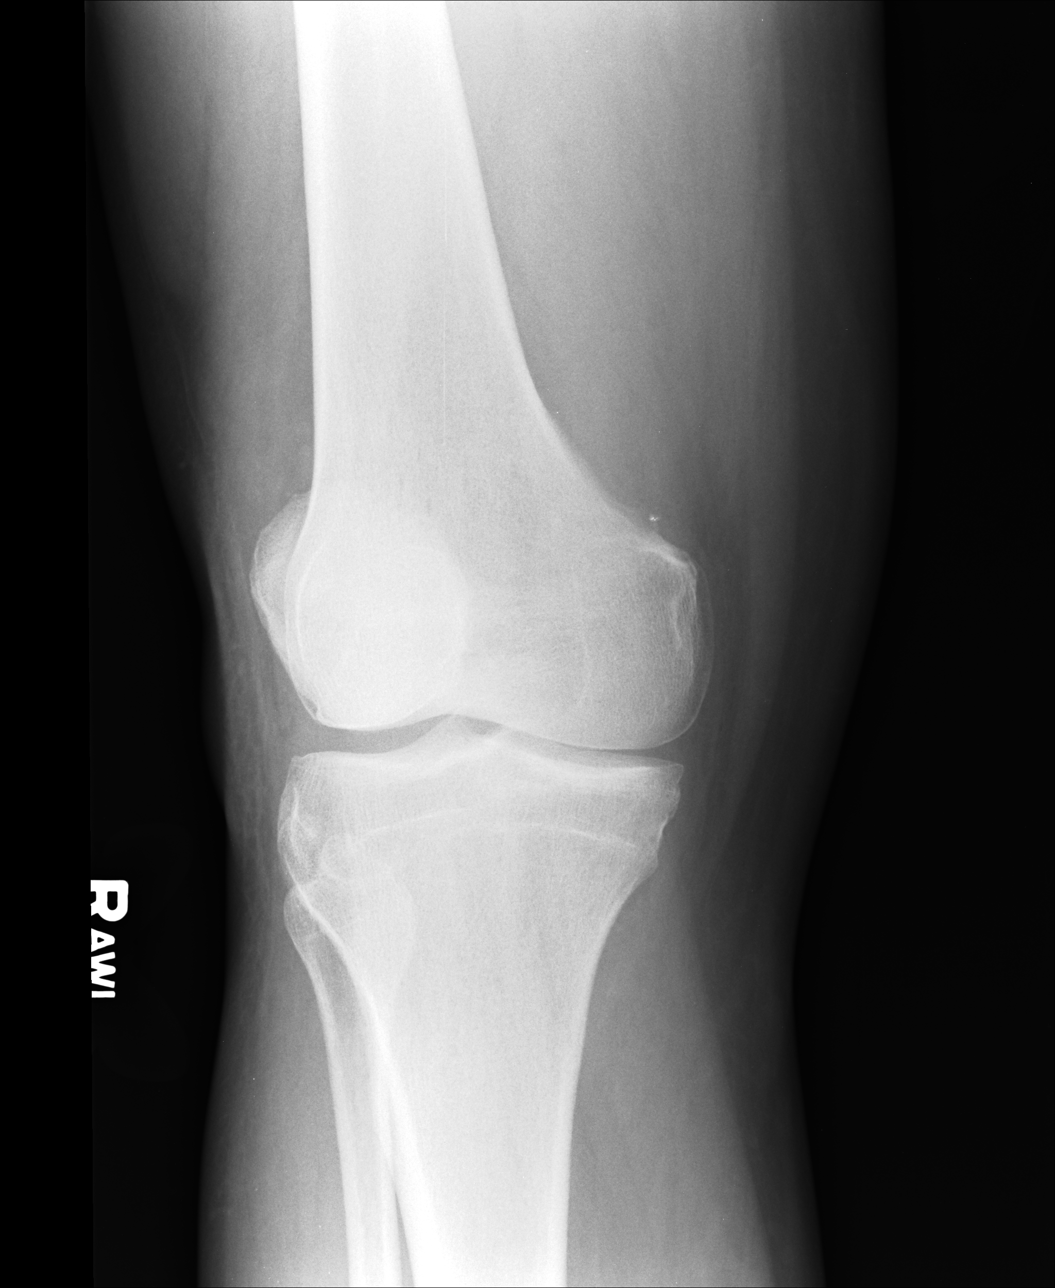

[4 of 4 positions shown; findings below may reference images not displayed]

FINDINGS: No acute fracture, dislocation, or osseous erosion. There is mild
marginal spurring consistent with early degeneration. The medial
compartment is mildly narrowed. Small knee joint effusion

likely present.
IMPRESSION: 1. No acute osseous findings.
2. Probable small knee joint effusion.
3. Mild knee osteoarthritis.
